# Patient Record
Sex: Male | Born: 1990 | Race: Black or African American | Hispanic: No | Marital: Single | Smoking: Current every day smoker
Health system: Southern US, Community
[De-identification: ages and names within clinical notes are randomized; demographics above are authoritative.]

---

## 2009-06-12 ENCOUNTER — Emergency Department (HOSPITAL_COMMUNITY): Admission: EM | Admit: 2009-06-12 | Discharge: 2009-06-12 | Payer: Self-pay | Admitting: Emergency Medicine

## 2011-11-16 ENCOUNTER — Emergency Department (INDEPENDENT_AMBULATORY_CARE_PROVIDER_SITE_OTHER)
Admission: EM | Admit: 2011-11-16 | Discharge: 2011-11-16 | Disposition: A | Discharge status: Home / Self Care | Payer: Self-pay | Source: Home / Self Care | Attending: Emergency Medicine | Admitting: Emergency Medicine

## 2011-11-16 DIAGNOSIS — J45909 Unspecified asthma, uncomplicated: Secondary | ICD-10-CM

## 2011-11-16 MED ORDER — ALBUTEROL SULFATE HFA 108 (90 BASE) MCG/ACT IN AERS: 1.0000 | INHALATION_SPRAY | Freq: Four times a day (QID) | RESPIRATORY_TRACT | Status: DC | PRN

## 2011-11-16 MED ORDER — PREDNISONE 20 MG PO TABS: 40.0000 mg | ORAL_TABLET | Freq: Every day | ORAL | Status: AC

## 2011-11-16 MED ORDER — PREDNISONE 10 MG PO TABS
ORAL_TABLET | ORAL | Status: AC
  Filled 2011-11-16: qty 4

## 2011-11-16 MED ORDER — IPRATROPIUM-ALBUTEROL 0.5-2.5 (3) MG/3ML IN SOLN
3.0000 mL | Freq: Once | RESPIRATORY_TRACT | Status: AC
  Administered 2011-11-16: 3 mL via RESPIRATORY_TRACT

## 2011-11-16 MED ORDER — PREDNISOLONE 5 MG PO TABS
40.0000 mg | ORAL_TABLET | Freq: Once | ORAL | Status: AC
  Administered 2011-11-16: 40 mg via ORAL

## 2011-11-16 MED ORDER — ALBUTEROL SULFATE (5 MG/ML) 0.5% IN NEBU
INHALATION_SOLUTION | RESPIRATORY_TRACT | Status: AC
  Filled 2011-11-16: qty 0.5
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
## 2011-11-16 NOTE — ED Notes (Signed)
3 day hx of coughing, wheezing.  hx of asthma.  not using inhaler, doesn't have one.  Coughing up yellowish sputum.  Nasal congestion.

## 2012-01-21 ENCOUNTER — Encounter (HOSPITAL_COMMUNITY): Payer: Self-pay | Admitting: Emergency Medicine

## 2012-01-21 ENCOUNTER — Emergency Department (INDEPENDENT_AMBULATORY_CARE_PROVIDER_SITE_OTHER)
Admission: EM | Admit: 2012-01-21 | Discharge: 2012-01-21 | Disposition: A | Discharge status: Home / Self Care | Payer: Self-pay | Source: Home / Self Care | Attending: Emergency Medicine | Admitting: Emergency Medicine

## 2012-01-21 DIAGNOSIS — M25569 Pain in unspecified knee: Secondary | ICD-10-CM

## 2012-01-21 MED ORDER — MELOXICAM 7.5 MG PO TABS: 7.5000 mg | ORAL_TABLET | Freq: Every day | ORAL | Status: AC
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
## 2013-03-19 ENCOUNTER — Ambulatory Visit (INDEPENDENT_AMBULATORY_CARE_PROVIDER_SITE_OTHER): Payer: BC Managed Care – PPO | Admitting: Physician Assistant

## 2013-03-19 VITALS — BP 130/76 | HR 80 | Temp 97.8°F | Resp 16 | Ht 73.5 in | Wt 144.6 lb

## 2013-03-19 DIAGNOSIS — J45909 Unspecified asthma, uncomplicated: Secondary | ICD-10-CM

## 2013-03-19 DIAGNOSIS — R21 Rash and other nonspecific skin eruption: Secondary | ICD-10-CM

## 2013-03-19 DIAGNOSIS — L500 Allergic urticaria: Secondary | ICD-10-CM

## 2013-03-19 MED ORDER — METHYLPREDNISOLONE ACETATE 80 MG/ML IJ SUSP
80.0000 mg | Freq: Once | INTRAMUSCULAR | Status: AC
  Administered 2013-03-19: 80 mg via INTRAMUSCULAR

## 2013-03-19 MED ORDER — ALBUTEROL SULFATE HFA 108 (90 BASE) MCG/ACT IN AERS: 1.0000 | INHALATION_SPRAY | Freq: Four times a day (QID) | RESPIRATORY_TRACT | Status: DC | PRN

## 2013-03-19 MED ORDER — PREDNISONE 20 MG PO TABS: ORAL_TABLET | ORAL | Status: DC

## 2013-03-19 NOTE — Progress Notes (Signed)
  Subjective:    Patient ID: Dustin Pennington, male    DOB: [birth year], 22 y.o.   MRN: [id number]  HPI 22 year old male presents with acute onset of pruritic rash all over his body. States symptoms started suddenly last night and have progressively worsened today.  States they are intensely pruritic. Did spend last night with a friend who does not have any similar rash.  Changed soaps and deodorant 1 week ago and wonders if they could be the culprit.  No other new medications or foods. No fever, chills, nausea, vomiting.  Denies lip/tongue swelling, SOB, or trouble breathing. No history of allergies. Does have asthma for which he uses albuterol prn.  Also requesting refill of this today.   He has not taken any OTC meds for the rash yet today.      Review of Systems  Constitutional: Negative for fever and chills.  HENT: Negative for trouble swallowing.   Respiratory: Negative for cough and shortness of breath.   Gastrointestinal: Negative for nausea and vomiting.  Skin: Positive for rash.  Neurological: Negative for dizziness.       Objective:   Physical Exam  Constitutional: He is oriented to person, place, and time. He appears well-developed and well-nourished.  HENT:  Head: Normocephalic and atraumatic.  Right Ear: External ear normal.  Left Ear: External ear normal.  Mouth/Throat: Oropharynx is clear and moist.  Eyes: Conjunctivae are normal.  Neck: Normal range of motion.  Cardiovascular: Normal rate, regular rhythm and normal heart sounds.   Pulmonary/Chest: Effort normal and breath sounds normal.  Neurological: He is alert and oriented to person, place, and time.  Skin:  Diffuse, erythematous rash and urticaria over entire body  Psychiatric: He has a normal mood and affect. His behavior is normal. Judgment and thought content normal.          Assessment & Plan:  Allergic urticaria - Plan: methylPREDNISolone acetate (DEPO-MEDROL) injection 80 mg, predniSONE (DELTASONE) 20  MG tablet  Rash and nonspecific skin eruption  Unspecified asthma - Plan: albuterol (PROVENTIL HFA;VENTOLIN HFA) 108 (90 BASE) MCG/ACT inhaler  Depomedrol 80 mg IM given today Start prednisone taper tomorrow Zyrtec daily in the a.m. Benadryl 25-50 mg at bedtime Follow up if symptoms worsen or fail to improve.

## 2013-03-19 NOTE — Patient Instructions (Addendum)
Take Zyrtec daily in the morning Benadryl 25-50 mg at bedtime Prednisone taper

## 2013-06-08 ENCOUNTER — Emergency Department (HOSPITAL_COMMUNITY): Payer: Self-pay

## 2013-06-08 ENCOUNTER — Emergency Department (HOSPITAL_COMMUNITY)
Admission: EM | Admit: 2013-06-08 | Discharge: 2013-06-08 | Disposition: A | Discharge status: Home / Self Care | Payer: Self-pay | Attending: Emergency Medicine | Admitting: Emergency Medicine

## 2013-06-08 DIAGNOSIS — J45901 Unspecified asthma with (acute) exacerbation: Secondary | ICD-10-CM | POA: Insufficient documentation

## 2013-06-08 DIAGNOSIS — Z79899 Other long term (current) drug therapy: Secondary | ICD-10-CM | POA: Insufficient documentation

## 2013-06-08 DIAGNOSIS — F17200 Nicotine dependence, unspecified, uncomplicated: Secondary | ICD-10-CM | POA: Insufficient documentation

## 2013-06-08 MED ORDER — PREDNISONE 10 MG PO TABS: 20.0000 mg | ORAL_TABLET | Freq: Two times a day (BID) | ORAL | Status: DC

## 2013-06-08 MED ORDER — ALBUTEROL SULFATE HFA 108 (90 BASE) MCG/ACT IN AERS
2.0000 | INHALATION_SPRAY | RESPIRATORY_TRACT | Status: DC | PRN
  Filled 2013-06-08: qty 6.7

## 2013-06-08 MED ORDER — ALBUTEROL SULFATE (5 MG/ML) 0.5% IN NEBU
5.0000 mg | INHALATION_SOLUTION | Freq: Once | RESPIRATORY_TRACT | Status: AC
  Administered 2013-06-08: 5 mg via RESPIRATORY_TRACT
  Filled 2013-06-08: qty 1

## 2013-06-08 NOTE — ED Provider Notes (Signed)
History     CSN: [id number]  Arrival date & time 06/08/13  0718   First MD Initiated Contact with Patient 06/08/13 0719      Chief Complaint  Patient presents with  . Asthma    (Consider location/radiation/quality/duration/timing/severity/associated sxs/prior treatment) HPI Comments: Patient with history of asthma.  Started wheezing last night but was able to get to sleep.  Woke this am with continued wheezing, shortness of breath.  He does report uri-like symptoms for the past few days.  No fevers or chills.  No chest pain.  Patient is a 22 y.o. male presenting with asthma. The history is provided by the patient.  Asthma This is a recurrent problem. Episode onset: last night. The problem occurs constantly. The problem has been gradually worsening. Nothing aggravates the symptoms. Nothing relieves the symptoms. He has tried nothing for the symptoms. The treatment provided no relief.    Past Medical History  Diagnosis Date  . Asthma     No past surgical history on file.  No family history on file.  History  Substance Use Topics  . Smoking status: Current Every Day Smoker -- 0.50 packs/day    Types: Cigarettes  . Smokeless tobacco: Never Used  . Alcohol Use: No      Review of Systems  All other systems reviewed and are negative.    Allergies  Review of patient's allergies indicates no known allergies.  Home Medications   Current Outpatient Rx  Name  Route  Sig  Dispense  Refill  . albuterol (PROVENTIL HFA;VENTOLIN HFA) 108 (90 BASE) MCG/ACT inhaler   Inhalation   Inhale 1-2 puffs into the lungs every 6 (six) hours as needed for wheezing.   1 Inhaler   5   . naproxen (NAPROSYN) 500 MG tablet   Oral   Take 500 mg by mouth 2 (two) times daily with a meal.         . predniSONE (DELTASONE) 20 MG tablet      Take 3 PO QAM x3days, 2 PO QAM x3days, 1 PO QAM x3days   18 tablet   0     BP 149/78  Pulse 94  Temp(Src) 97.7 F (36.5 C) (Oral)  SpO2  94%  Physical Exam  Nursing note and vitals reviewed. Constitutional: He is oriented to person, place, and time. He appears well-developed and well-nourished. No distress.  HENT:  Head: Normocephalic and atraumatic.  Mouth/Throat: Oropharynx is clear and moist.  Neck: Normal range of motion. Neck supple.  Cardiovascular: Normal rate and regular rhythm.   No murmur heard. Pulmonary/Chest: Effort normal. He has wheezes.  There are bilateral expiratory wheezes present.  No rales.  Abdominal: Soft. Bowel sounds are normal. He exhibits no distension. There is no tenderness.  Musculoskeletal: Normal range of motion. He exhibits no edema.  Lymphadenopathy:    He has no cervical adenopathy.  Neurological: He is alert and oriented to person, place, and time.  Skin: Skin is warm and dry. He is not diaphoretic.    ED Course  Procedures (including critical care time)  Labs Reviewed - No data to display No results found.   No diagnosis found.    MDM  The xray is negative and the wheezing has resolved with the neb treatment.  His sats were initially 93% and have now improved to 99% after treatment.  Will give mdi, treat with steroids.  Return prn.        Geoffery Lyons, MD 06/08/13 0830

## 2013-06-08 NOTE — ED Notes (Signed)
Pt reports having asthma and wheezing that started last night.  No signs of respiratory distress.  Pt alert oriented X4

## 2013-10-23 ENCOUNTER — Emergency Department (HOSPITAL_COMMUNITY)
Admission: EM | Admit: 2013-10-23 | Discharge: 2013-10-23 | Disposition: A | Discharge status: Home / Self Care | Payer: BC Managed Care – PPO | Source: Home / Self Care | Attending: Family Medicine | Admitting: Family Medicine

## 2013-10-23 ENCOUNTER — Encounter (HOSPITAL_COMMUNITY): Payer: Self-pay | Admitting: Emergency Medicine

## 2013-10-23 DIAGNOSIS — J45909 Unspecified asthma, uncomplicated: Secondary | ICD-10-CM

## 2013-10-23 DIAGNOSIS — S61209A Unspecified open wound of unspecified finger without damage to nail, initial encounter: Secondary | ICD-10-CM

## 2013-10-23 DIAGNOSIS — S61412A Laceration without foreign body of left hand, initial encounter: Secondary | ICD-10-CM

## 2013-10-23 DIAGNOSIS — J4520 Mild intermittent asthma, uncomplicated: Secondary | ICD-10-CM

## 2013-10-23 MED ORDER — ALBUTEROL SULFATE HFA 108 (90 BASE) MCG/ACT IN AERS: 2.0000 | INHALATION_SPRAY | Freq: Four times a day (QID) | RESPIRATORY_TRACT | Status: DC | PRN

## 2013-10-23 NOTE — ED Notes (Signed)
Sutures in left index finger.  Placed 12 days ago at [facility] hospital.  Healing wound

## 2013-10-23 NOTE — ED Provider Notes (Signed)
Dustin Pennington is a 22 y.o. male who presents to Urgent Care today for  1) followup finger laceration. Patient was seen at [facility] 12 days prior for it left second digit dorsal laceration of the skin overlying the proximal phalanx. He was given a tetanus shot, pain medications and 4 simple interrupted sutures. He feels well with no complaint. No redness tenderness fevers or chill.  2) asthma: Patient has a history of mild intermittent well-controlled asthma. He uses an albuterol inhaler once a week on average. He has run out of albuterol would like a refill. He is completely asymptomatic currently. No wheezing chest congestion cough or shortness of breath.  He does not have a primary care provider.    Past Medical History  Diagnosis Date  . Asthma    History  Substance Use Topics  . Smoking status: Current Every Day Smoker -- 0.50 packs/day    Types: Cigarettes  . Smokeless tobacco: Never Used  . Alcohol Use: No   ROS as above Medications reviewed. No current facility-administered medications for this encounter.   Current Outpatient Prescriptions  Medication Sig Dispense Refill  . albuterol (PROVENTIL HFA;VENTOLIN HFA) 108 (90 BASE) MCG/ACT inhaler Inhale 2 puffs into the lungs every 6 (six) hours as needed for wheezing.  1 Inhaler  2    Exam:  BP 121/75  Pulse 60  Temp(Src) 98.4 F (36.9 C) (Oral)  Resp 14  SpO2 100% Gen: Well NAD HEENT: EOMI,  MMM Lungs: CTABL Nl WOB Heart: RRR no MRG Abd: NABS, NT, ND Exts: Non edematous BL  LE, warm and well perfused.  Left second digit: Well appearing laceration. No erythema or exudate or tenderness. 4 simple interrupted sutures with Prolene are visible.  Hand motion strength and sensation and capillary refill is intact  The sutures were removed.   Assessment and Plan: 22 y.o. male with  1) hand laceration: Sutures removed today. Scar minimization handout provided. Followup as needed. 2)  Mild intermittent asthma:  Refill albuterol. Refer to primary care provider.  Discussed warning signs or symptoms. Please see discharge instructions. Patient expresses understanding.      Rodolph Bong, MD 10/23/13 [phone]

## 2014-01-30 ENCOUNTER — Ambulatory Visit (INDEPENDENT_AMBULATORY_CARE_PROVIDER_SITE_OTHER): Payer: BC Managed Care – PPO | Admitting: Family Medicine

## 2014-01-30 VITALS — BP 118/76 | HR 71 | Temp 98.7°F | Resp 16 | Ht 73.5 in | Wt 150.8 lb

## 2014-01-30 DIAGNOSIS — L299 Pruritus, unspecified: Secondary | ICD-10-CM

## 2014-01-30 DIAGNOSIS — L738 Other specified follicular disorders: Secondary | ICD-10-CM

## 2014-01-30 DIAGNOSIS — Z113 Encounter for screening for infections with a predominantly sexual mode of transmission: Secondary | ICD-10-CM

## 2014-01-30 DIAGNOSIS — L853 Xerosis cutis: Secondary | ICD-10-CM

## 2014-01-30 MED ORDER — HYDROXYZINE HCL 25 MG PO TABS: 12.5000 mg | ORAL_TABLET | Freq: Three times a day (TID) | ORAL | Status: DC | PRN

## 2014-01-30 NOTE — Progress Notes (Signed)
Subjective: 23 year old man who has a history of itching recently. When he scratches it a lot it causes red welts. He has a history of allergic asthma.  He is looked up some things in his right about STDs causing itching. He would like to be checked for STDs. I explained that that would be a rare presentation of any STDs, we will check him.  Objective: Skin looks fairly unremarkable. He has some excoriations on his legs where he has been scratching at them. The skin does look dry  Assessment: Itching skin and dry skin STD risk  Plan: STD testing Hydroxyzine Mineral oral on the skin  Return if problems

## 2014-01-30 NOTE — Patient Instructions (Addendum)
Take the hydroxyzine one half to one tablet every 8 hours as needed for itching. It will cause a little drowsiness, so you may only want to take one half of a pill when you're going to work.  We will let you know the results STD tests  Takes only very brief showers, and less often, and immediately after padding your skin dry rub on a little mineral oral (which you can buy at the pharmacy)  Return if worse

## 2014-01-31 LAB — HIV ANTIBODY (ROUTINE TESTING W REFLEX): HIV: NONREACTIVE

## 2014-01-31 LAB — RPR

## 2014-02-01 LAB — GC/CHLAMYDIA PROBE AMP
CT PROBE, AMP APTIMA: NEGATIVE
GC PROBE AMP APTIMA: NEGATIVE

## 2014-02-06 LAB — HSV(HERPES SIMPLEX VRS) I + II AB-IGG
HSV 1 GLYCOPROTEIN G AB, IGG: 0.48 IV
HSV 2 Glycoprotein G Ab, IgG: <0.1 IV

## 2014-07-14 ENCOUNTER — Emergency Department (HOSPITAL_COMMUNITY)
Admission: EM | Admit: 2014-07-14 | Discharge: 2014-07-14 | Disposition: A | Discharge status: Home / Self Care | Payer: BC Managed Care – PPO | Source: Home / Self Care | Attending: Family Medicine | Admitting: Family Medicine

## 2014-07-14 ENCOUNTER — Encounter (HOSPITAL_COMMUNITY): Payer: Self-pay | Admitting: Emergency Medicine

## 2014-07-14 ENCOUNTER — Emergency Department (INDEPENDENT_AMBULATORY_CARE_PROVIDER_SITE_OTHER): Payer: BC Managed Care – PPO

## 2014-07-14 DIAGNOSIS — T148XXA Other injury of unspecified body region, initial encounter: Secondary | ICD-10-CM

## 2014-07-14 DIAGNOSIS — IMO0002 Reserved for concepts with insufficient information to code with codable children: Secondary | ICD-10-CM

## 2014-07-14 DIAGNOSIS — S6980XA Other specified injuries of unspecified wrist, hand and finger(s), initial encounter: Secondary | ICD-10-CM

## 2014-07-14 DIAGNOSIS — S6990XA Unspecified injury of unspecified wrist, hand and finger(s), initial encounter: Secondary | ICD-10-CM

## 2014-07-14 DIAGNOSIS — S6991XA Unspecified injury of right wrist, hand and finger(s), initial encounter: Secondary | ICD-10-CM

## 2014-07-14 DIAGNOSIS — L03113 Cellulitis of right upper limb: Secondary | ICD-10-CM

## 2014-07-14 MED ORDER — CEPHALEXIN 500 MG PO CAPS: 500.0000 mg | ORAL_CAPSULE | Freq: Three times a day (TID) | ORAL | Status: DC

## 2014-07-14 MED ORDER — IBUPROFEN 800 MG PO TABS
800.0000 mg | ORAL_TABLET | Freq: Once | ORAL | Status: AC
  Administered 2014-07-14: 800 mg via ORAL

## 2014-07-14 MED ORDER — ALBUTEROL SULFATE HFA 108 (90 BASE) MCG/ACT IN AERS: 2.0000 | INHALATION_SPRAY | Freq: Four times a day (QID) | RESPIRATORY_TRACT | Status: AC | PRN

## 2014-07-14 MED ORDER — CEPHALEXIN 500 MG PO CAPS: 500.0000 mg | ORAL_CAPSULE | Freq: Three times a day (TID) | ORAL | Status: AC

## 2014-07-14 MED ORDER — IBUPROFEN 800 MG PO TABS
ORAL_TABLET | ORAL | Status: AC
  Filled 2014-07-14: qty 1

## 2014-07-14 NOTE — ED Notes (Signed)
Per VO from Dr. Konrad DoloresMerrell,  Had pt soak right middle finger in betadine solution.

## 2014-07-14 NOTE — Discharge Instructions (Signed)
You have developed cellulitis of a skin infection of your injured finger. The dead and infected tissue from your cut was cleaned out.  This will require antibiotics to clear Please take the antibiotics as prescribed adn until they are gone Please call us if you are not getting better or if you get worse

## 2014-07-14 NOTE — ED Provider Notes (Addendum)
CSN: [id number]     Arrival date & time 07/14/14  1721 History   None    Chief Complaint  Patient presents with  . Extremity Laceration   (Consider location/radiation/quality/duration/timing/severity/associated sxs/prior Treatment) HPI R middle finger injury: occurred 2 days ago. Metal latch on screen door closed on finger. Initially bloody then resolved. Ibuprfon, peroxide, and alcohol w/o much benefit. Swelling started yesterday. Getting worse. Stiff. Sensation intact. Deneis fevers, rash, chills.    Past Medical History  Diagnosis Date  . Asthma    History reviewed. No pertinent past surgical history. No family history on file. History  Substance Use Topics  . Smoking status: Current Every Day Smoker -- 0.50 packs/day    Types: Cigarettes  . Smokeless tobacco: Never Used  . Alcohol Use: No    Review of Systems Per HPI with all other pertinent systems negative.   Allergies  Review of patient's allergies indicates no known allergies.  Home Medications   Prior to Admission medications   Medication Sig Start Date End Date Taking? Authorizing Provider  albuterol (PROVENTIL HFA;VENTOLIN HFA) 108 (90 BASE) MCG/ACT inhaler Inhale 2 puffs into the lungs every 6 (six) hours as needed for wheezing. 10/23/13   Rodolph BongEvan S Corey, MD  cephALEXin (KEFLEX) 500 MG capsule Take 1 capsule (500 mg total) by mouth 3 (three) times daily. 07/14/14   Ozella Rocksavid J Merrell, MD  hydrOXYzine (ATARAX/VISTARIL) 25 MG tablet Take 0.5-1 tablets (12.5-25 mg total) by mouth every 8 (eight) hours as needed for itching. 01/30/14   Peyton Najjaravid H Hopper, MD   BP 107/72  Pulse 85  Temp(Src) 98.5 F (36.9 C) (Oral)  Resp 16  SpO2 100% Physical Exam  Constitutional: He is oriented to person, place, and time. He appears well-developed and well-nourished. No distress.  HENT:  Head: Normocephalic and atraumatic.  Eyes: EOM are normal. Pupils are equal, round, and reactive to light.  Neck: Normal range of motion. Neck supple.   Cardiovascular: Normal rate, normal heart sounds and intact distal pulses.   No murmur heard. Pulmonary/Chest: Effort normal and breath sounds normal.  Abdominal: Soft. He exhibits no distension.  Musculoskeletal: Normal range of motion.  Neurological: He is alert and oriented to person, place, and time. No cranial nerve deficit.  Skin: Skin is warm. He is not diaphoretic.  R middle finger swollen, erythematous and ttp. 1cm laceration of just distal to the PIP w/ small central necrosis and purulent discharge.   Psychiatric: He has a normal mood and affect. His behavior is normal. Judgment and thought content normal.    ED Course  Procedures (including critical care time) Labs Review Labs Reviewed - No data to display  Imaging Review No results found.  Wound debridement of the R middle finger. After obtaining verbal consent the base of thefinger was sterilized w/ alcohol swabs and 5cc 2% lidocaine w/o epi was injected to obtain bilat digital block. Finger was then cleaned and area of laceration was debrided w/ copious ns and betadine. Purulent discharge present. Small amount of necrotic tissue present near the PIP. No joint involvement.     MDM   1. Finger injury, right, initial encounter   2. Laceration   3. Cellulitis of right upper extremity    Finger injury w/ superficial wound that became infected w/ proximal and distal tracking cellulitis. Wound debrided as above. Healing by secondary intention. No fracture. Keflex 500 TID x 7 days. Pt w/ clear instructions to call if not improving or becoming worse as may need  to add MRSA coverage.  Precautions given and all questions answered  Asthma: nearly out of inhaler. Uses 2-3 x wkly. Needs refill as unable to get in w/ pcp soon. Will refill x1.    Shelly Flatten, MD Family Medicine 07/14/2014, 6:02 PM      Ozella Rocks, MD 07/14/14 1610  Ozella Rocks, MD 07/14/14 [phone]

## 2014-07-14 NOTE — ED Notes (Signed)
Pt reports laceration to right middle finger onset 3 days Sx include swelling, redness and tender States he cut it w/metal part of screened door Alert w/nosigns of acute distress.

## 2014-07-15 ENCOUNTER — Ambulatory Visit (INDEPENDENT_AMBULATORY_CARE_PROVIDER_SITE_OTHER): Payer: BC Managed Care – PPO | Admitting: Family Medicine

## 2014-07-15 ENCOUNTER — Ambulatory Visit (INDEPENDENT_AMBULATORY_CARE_PROVIDER_SITE_OTHER): Payer: BC Managed Care – PPO

## 2014-07-15 VITALS — BP 118/68 | HR 67 | Temp 98.4°F | Resp 16 | Ht 72.0 in | Wt 147.6 lb

## 2014-07-15 DIAGNOSIS — R1012 Left upper quadrant pain: Secondary | ICD-10-CM

## 2014-07-15 LAB — POCT URINALYSIS DIPSTICK
Bilirubin, UA: NEGATIVE
Glucose, UA: NEGATIVE
Ketones, UA: NEGATIVE
Leukocytes, UA: NEGATIVE
Nitrite, UA: NEGATIVE
Protein, UA: NEGATIVE
Spec Grav, UA: 1.015
Urobilinogen, UA: 1
pH, UA: 5.5

## 2014-07-15 LAB — POCT CBC
Granulocyte percent: 62 %G (ref 37–80)
HCT, POC: 45.4 % (ref 43.5–53.7)
Hemoglobin: 14.9 g/dL (ref 14.1–18.1)
Lymph, poc: 2.1 (ref 0.6–3.4)
MCH, POC: 27.5 pg (ref 27–31.2)
MCHC: 32.8 g/dL (ref 31.8–35.4)
MCV: 84 fL (ref 80–97)
MID (cbc): 0.4 (ref 0–0.9)
MPV: 8.6 fL (ref 0–99.8)
POC Granulocyte: 4 (ref 2–6.9)
POC LYMPH PERCENT: 32.5 %L (ref 10–50)
POC MID %: 5.5 %M (ref 0–12)
Platelet Count, POC: 192 10*3/uL (ref 142–424)
RBC: 5.4 M/uL (ref 4.69–6.13)
RDW, POC: 13.2 %
WBC: 6.5 10*3/uL (ref 4.6–10.2)

## 2014-07-15 LAB — POCT UA - MICROSCOPIC ONLY
Bacteria, U Microscopic: NEGATIVE
Casts, Ur, LPF, POC: NEGATIVE
Crystals, Ur, HPF, POC: NEGATIVE
Mucus, UA: NEGATIVE
Yeast, UA: NEGATIVE

## 2014-07-15 MED ORDER — HYOSCYAMINE SULFATE 0.125 MG SL SUBL: 0.1250 mg | SUBLINGUAL_TABLET | Freq: Two times a day (BID) | SUBLINGUAL | Status: AC | PRN

## 2014-07-15 NOTE — Progress Notes (Addendum)
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 He was seen yesterday for an infected right middle finger and put on Keflex.  Comes in today because he's had 2 days of abdominal pain in the left upper quadrant. It's worse when he moves and after he eats. Said no nausea, vomiting, or diarrhea. He's also had no fever.  Patient denies having this in the past, and is taking no medications for it.  Patient has no shortness of breath or chest pain  Objective: No acute distress HEENT: Unremarkable Chest: Clear Heart: Regular no murmur Abdomen: Soft and tender in the left upper quadrant and left epigastrium. There is no masses or HSM. There is no guarding or rebound Skin: Unremarkable with no rash (multiple tattoos on arms) Extremities: Normal gait, no edema  Results for orders placed in visit on 07/15/14  POCT CBC      Result Value Ref Range   WBC 6.5  4.6 - 10.2 K/uL   Lymph, poc 2.1  0.6 - 3.4   POC LYMPH PERCENT 32.5  10 - 50 %L   MID (cbc) 0.4  0 - 0.9   POC MID % 5.5  0 - 12 %M   POC Granulocyte 4.0  2 - 6.9   Granulocyte percent 62.0  37 - 80 %G   RBC 5.40  4.69 - 6.13 M/uL   Hemoglobin 14.9  14.1 - 18.1 g/dL   HCT, POC 16.145.4  09.643.5 - 53.7 %   MCV 84.0  80 - 97 fL   MCH, POC 27.5  27 - 31.2 pg   MCHC 32.8  31.8 - 35.4 g/dL   RDW, POC 04.513.2     Platelet Count, POC 192  142 - 424 K/uL   MPV 8.6  0 - 99.8 fL  POCT UA - MICROSCOPIC ONLY      Result Value Ref Range   WBC, Ur, HPF, POC 0-2     RBC, urine, microscopic 2-7     Bacteria, U Microscopic neg     Mucus, UA neg     Epithelial cells, urine per micros 0-1     Crystals, Ur, HPF, POC neg     Casts, Ur, LPF, POC neg     Yeast, UA neg    POCT URINALYSIS DIPSTICK      Result Value Ref Range   Color, UA yellow     Clarity, UA clear     Glucose, UA neg     Bilirubin, UA neg     Ketones, UA neg     Spec Grav, UA 1.015     Blood, UA trace-lysed     pH, UA 5.5     Protein, UA neg     Urobilinogen, UA 1.0     Nitrite, UA neg     Leukocytes, UA Negative    [facility] reading (PRIMARY) by  Dr. Milus GlazierLauenstein:  Nonspecific bowel gas pattern with retained stool.    Assessment colon splenic flexure syndrome, symptomatic with mild obstipation.  Plan:  miralax and levsin Abdominal pain, left upper quadrant - Plan: POCT CBC, POCT UA - Microscopic Only, POCT urinalysis dipstick, DG Abd 1 View    Elvina SidleKurt Tommie Bohlken, MD

## 2014-07-15 NOTE — Patient Instructions (Signed)
To have a partial blockage in the left upper part of your abdomen. You are going to need to take a bowel relaxer twice a day for three days.  Also, pick up some Miralax powder and take one capful in 8 oz of water daily for three days.  return if pain persists.

## 2014-07-16 ENCOUNTER — Telehealth: Payer: Self-pay

## 2014-07-16 NOTE — Telephone Encounter (Signed)
PT STATES HE WAS STILL HAVING STOMACH PAINS WHEN HE GOT UP THIS MORNING AND WOULD LIKE A NOTE FOR WORK FOR TODAY PLEASE CALL [phone] WHEN READY FOR PICK UP

## 2014-07-16 NOTE — Telephone Encounter (Signed)
Spoke to pt, he is aware  Work note will be waiting at the front for p/u.

## 2015-08-18 IMAGING — CR DG FINGER MIDDLE 2+V*R*
3 series · 3 of 3 positions shown · non-contrast
Comparison: None.

CLINICAL DATA: 23-year-old male middle finger injury, pain and
swelling.

EXAM:
RIGHT MIDDLE FINGER 2+V

[view not recorded (1 of 3)]
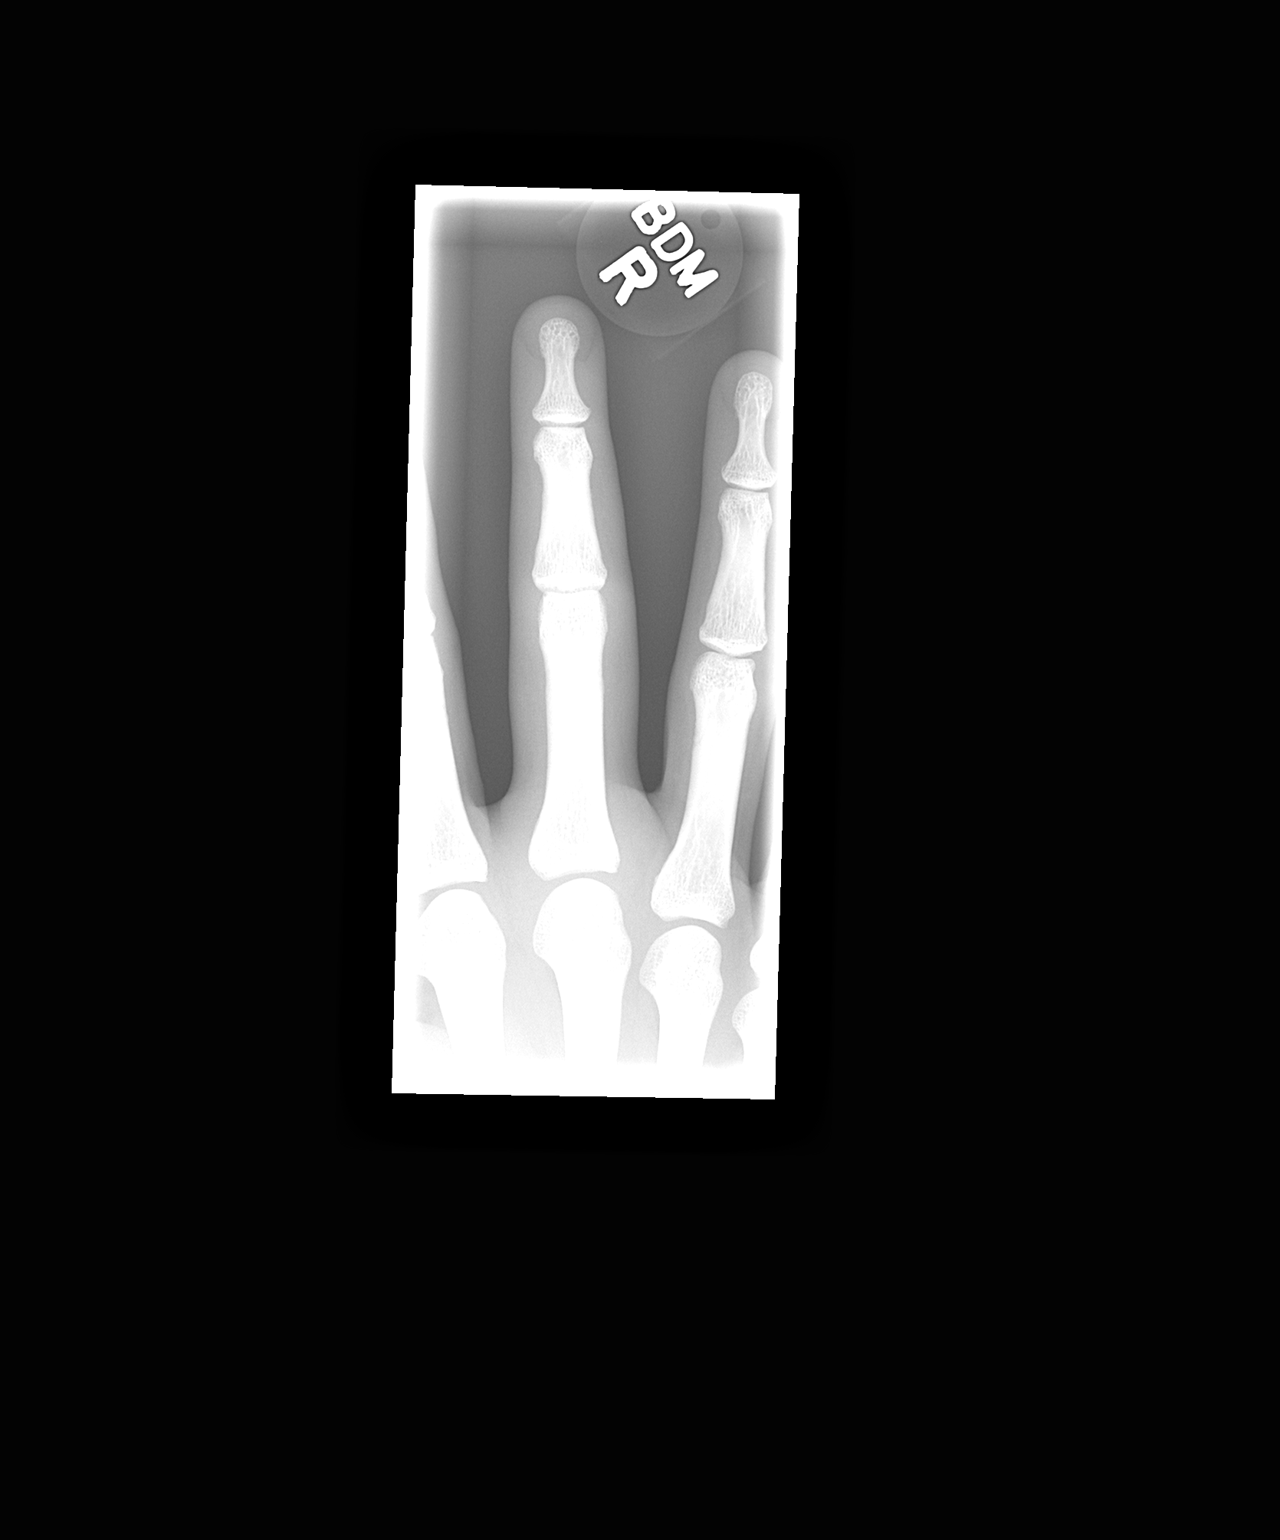

[view not recorded (2 of 3)]
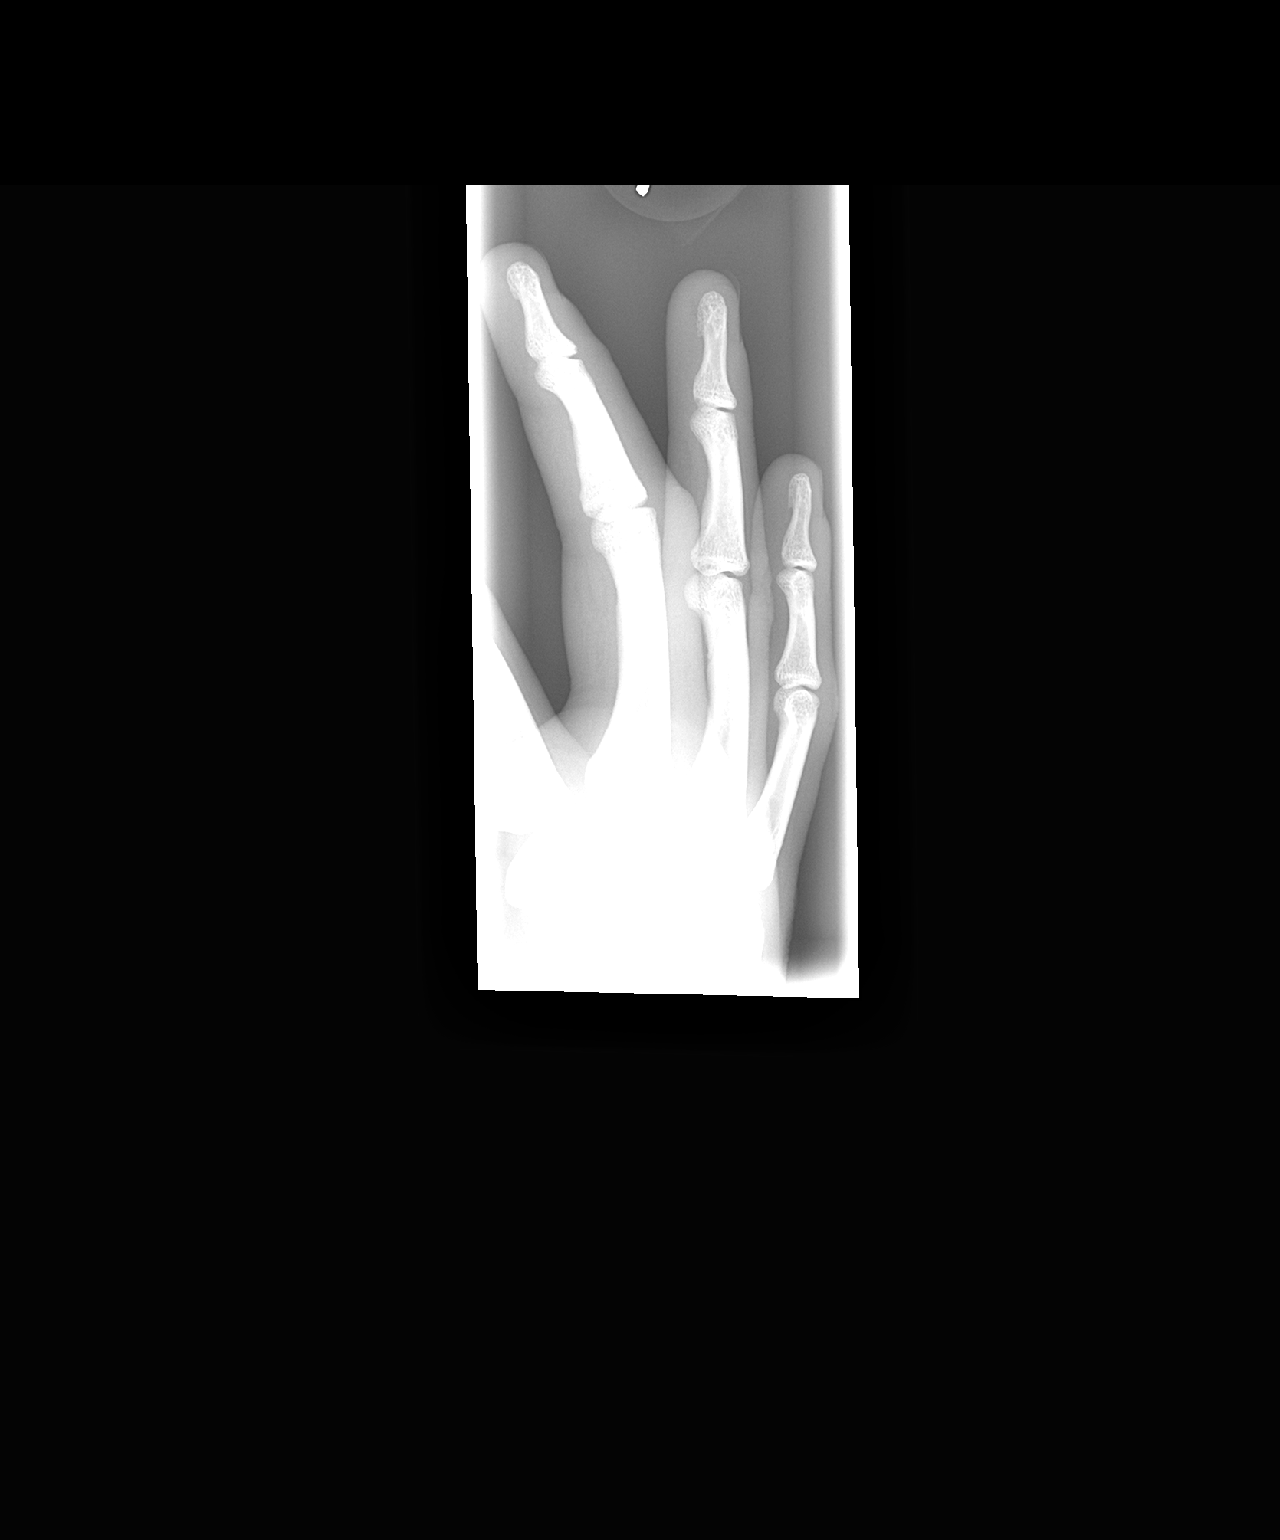

[view not recorded (3 of 3)]
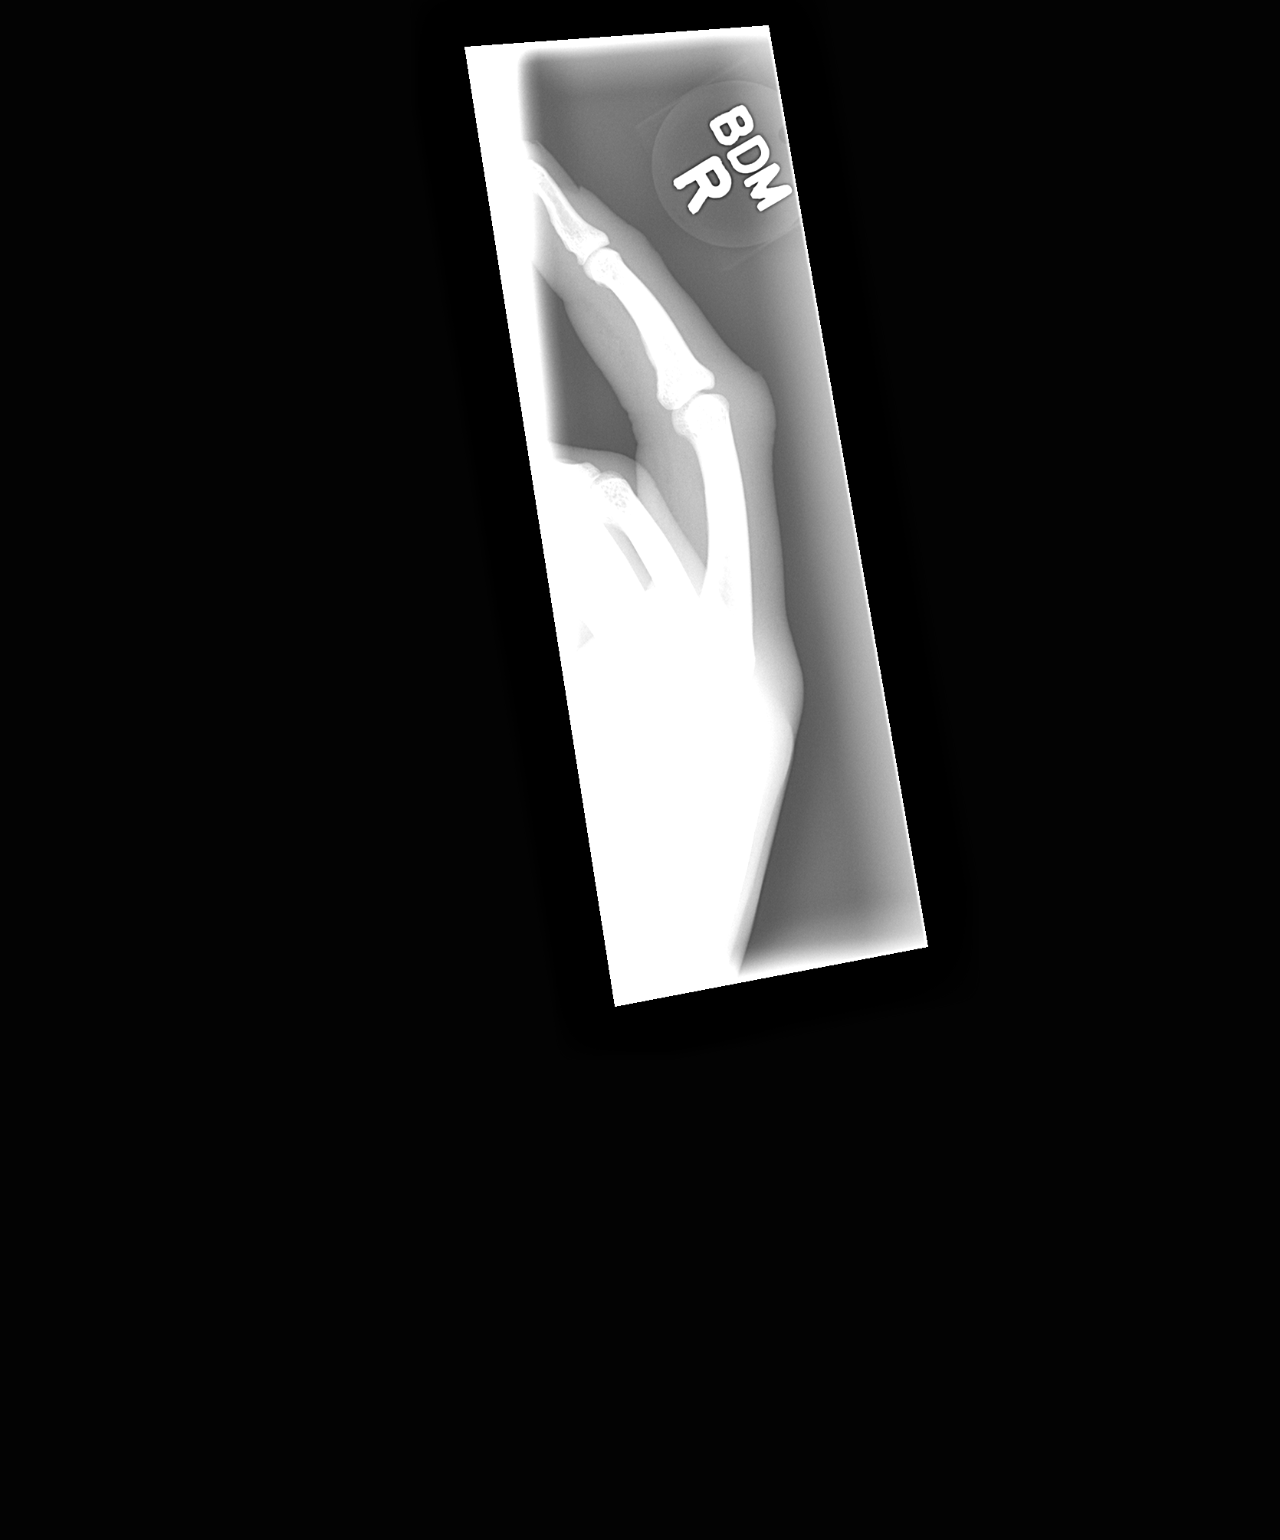

[3 of 3 positions shown; findings below may reference images not displayed]

FINDINGS: There is no evidence of fracture or dislocation. There is no
evidence of arthropathy or other focal bone abnormality.

Mild soft tissue swelling is noted.
IMPRESSION: Mild soft tissue swelling without bony abnormality.

## 2020-02-04 ENCOUNTER — Emergency Department (HOSPITAL_BASED_OUTPATIENT_CLINIC_OR_DEPARTMENT_OTHER)
Admission: EM | Admit: 2020-02-04 | Discharge: 2020-02-04 | Disposition: A | Discharge status: Home / Self Care | Payer: 59 | Attending: Emergency Medicine | Admitting: Emergency Medicine

## 2020-02-04 ENCOUNTER — Other Ambulatory Visit: Payer: Self-pay

## 2020-02-04 ENCOUNTER — Emergency Department (HOSPITAL_BASED_OUTPATIENT_CLINIC_OR_DEPARTMENT_OTHER): Payer: 59

## 2020-02-04 ENCOUNTER — Encounter (HOSPITAL_BASED_OUTPATIENT_CLINIC_OR_DEPARTMENT_OTHER): Payer: Self-pay | Admitting: Emergency Medicine

## 2020-02-04 DIAGNOSIS — R519 Headache, unspecified: Secondary | ICD-10-CM | POA: Insufficient documentation

## 2020-02-04 DIAGNOSIS — Z87820 Personal history of traumatic brain injury: Secondary | ICD-10-CM | POA: Diagnosis not present

## 2020-02-04 DIAGNOSIS — Z79899 Other long term (current) drug therapy: Secondary | ICD-10-CM | POA: Diagnosis not present

## 2020-02-04 DIAGNOSIS — F17210 Nicotine dependence, cigarettes, uncomplicated: Secondary | ICD-10-CM | POA: Diagnosis not present

## 2020-02-04 DIAGNOSIS — J45909 Unspecified asthma, uncomplicated: Secondary | ICD-10-CM | POA: Diagnosis not present

## 2020-02-04 DIAGNOSIS — G518 Other disorders of facial nerve: Secondary | ICD-10-CM | POA: Insufficient documentation

## 2020-02-04 MED ORDER — PROCHLORPERAZINE EDISYLATE 10 MG/2ML IJ SOLN
10.0000 mg | Freq: Once | INTRAMUSCULAR | Status: AC
  Administered 2020-02-04: 10 mg via INTRAVENOUS

## 2020-02-04 MED ORDER — PROCHLORPERAZINE EDISYLATE 10 MG/2ML IJ SOLN
10.0000 mg | Freq: Once | INTRAMUSCULAR | Status: DC
  Filled 2020-02-04: qty 2

## 2020-02-04 MED ORDER — IOHEXOL 350 MG/ML SOLN
100.0000 mL | Freq: Once | INTRAVENOUS | Status: AC | PRN
  Administered 2020-02-04: 100 mL via INTRAVENOUS

## 2020-02-04 MED ORDER — DEXAMETHASONE 6 MG PO TABS
10.0000 mg | ORAL_TABLET | Freq: Once | ORAL | Status: AC
  Administered 2020-02-04: 19:00:00 10 mg via ORAL
  Filled 2020-02-04: qty 1

## 2020-02-04 NOTE — Discharge Instructions (Signed)
Follow-up with a neurologist in the office.  Please return for worsening headache or new trouble with your vision or one-sided weakness or numbness or difficulty with speech or swallowing.  Also return for fever or neck stiffness.
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
## 2020-02-04 NOTE — ED Notes (Signed)
Patient transported to CT 

## 2020-02-04 NOTE — ED Notes (Signed)
Pt reports 500mg  at 1300 today.

## 2020-02-06 ENCOUNTER — Encounter: Payer: Self-pay | Admitting: Neurology
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
## 2020-03-05 ENCOUNTER — Ambulatory Visit: Payer: 59 | Admitting: Neurology

## 2020-04-29 ENCOUNTER — Ambulatory Visit: Payer: 59 | Admitting: Neurology
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
## 2021-05-25 ENCOUNTER — Emergency Department (HOSPITAL_COMMUNITY): Payer: 59

## 2021-05-25 ENCOUNTER — Emergency Department (HOSPITAL_COMMUNITY)
Admission: EM | Admit: 2021-05-25 | Discharge: 2021-05-25 | Disposition: A | Discharge status: Home / Self Care | Payer: 59 | Attending: Emergency Medicine | Admitting: Emergency Medicine

## 2021-05-25 ENCOUNTER — Other Ambulatory Visit: Payer: Self-pay

## 2021-05-25 DIAGNOSIS — M25522 Pain in left elbow: Secondary | ICD-10-CM | POA: Diagnosis not present

## 2021-05-25 DIAGNOSIS — R079 Chest pain, unspecified: Secondary | ICD-10-CM

## 2021-05-25 DIAGNOSIS — Y92410 Unspecified street and highway as the place of occurrence of the external cause: Secondary | ICD-10-CM | POA: Insufficient documentation

## 2021-05-25 DIAGNOSIS — J45909 Unspecified asthma, uncomplicated: Secondary | ICD-10-CM | POA: Diagnosis not present

## 2021-05-25 DIAGNOSIS — F17210 Nicotine dependence, cigarettes, uncomplicated: Secondary | ICD-10-CM | POA: Diagnosis not present

## 2021-05-25 NOTE — Progress Notes (Signed)
Orthopedic Tech Progress Note Patient Details:  Dustin Pennington [birth year] [id number]  Ortho Devices Type of Ortho Device: Sling immobilizer Ortho Device/Splint Location: lue Ortho Device/Splint Interventions: Ordered,Application,Adjustment   Post Interventions Patient Tolerated: Well Instructions Provided: Care of device,Adjustment of device   [name] 05/25/2021, 3:04 AM

## 2021-05-25 NOTE — Discharge Instructions (Signed)
You can take Tylenol, Motrin, drink plenty of water for the next few days.  You will be very sore the next few days.  Your x-rays did not show any broken bones in your chest or elbow.  I would still advise that you wear the arm sling at all times (except showering) for the next 7 days.  If you are still having a lot of pain in your elbow, or difficulty straightening your arm, you can call to make a follow-up appointment with the orthopedic hand surgeon at the number provided above.

## 2021-05-25 NOTE — ED Notes (Signed)
Pt left with out paperwork. Note pt and visitor not at bedside at this time

## 2021-05-25 NOTE — ED Notes (Signed)
Patient transported to X-ray 
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
## 2021-05-25 NOTE — ED Notes (Signed)
Patient denies pain and is resting comfortably.  
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
